# Patient Record
Sex: Male | Born: 1945 | Hispanic: No | Marital: Married | Smoking: Never smoker
Health system: Southern US, Community
[De-identification: ages and names within clinical notes are randomized; demographics above are authoritative.]

## PROBLEM LIST (undated history)

## (undated) HISTORY — PX: LEG AMPUTATION: SHX1105

---

## 2015-06-03 ENCOUNTER — Encounter (HOSPITAL_COMMUNITY): Payer: Self-pay | Admitting: Cardiology

## 2015-06-03 ENCOUNTER — Emergency Department (HOSPITAL_COMMUNITY): Payer: Medicare Other

## 2015-06-03 ENCOUNTER — Observation Stay (HOSPITAL_COMMUNITY)
Admission: EM | Admit: 2015-06-03 | Discharge: 2015-06-04 | Disposition: A | Discharge status: Home / Self Care | Payer: Medicare Other | Attending: Internal Medicine | Admitting: Internal Medicine

## 2015-06-03 DIAGNOSIS — R079 Chest pain, unspecified: Principal | ICD-10-CM | POA: Diagnosis present

## 2015-06-03 DIAGNOSIS — G8929 Other chronic pain: Secondary | ICD-10-CM | POA: Diagnosis not present

## 2015-06-03 DIAGNOSIS — M79605 Pain in left leg: Secondary | ICD-10-CM | POA: Diagnosis not present

## 2015-06-03 DIAGNOSIS — E785 Hyperlipidemia, unspecified: Secondary | ICD-10-CM

## 2015-06-03 LAB — CBC
HCT: 45.9 % (ref 39.0–52.0)
Hemoglobin: 15.5 g/dL (ref 13.0–17.0)
MCH: 28.7 pg (ref 26.0–34.0)
MCHC: 33.8 g/dL (ref 30.0–36.0)
MCV: 84.8 fL (ref 78.0–100.0)
Platelets: 224 K/uL (ref 150–400)
RBC: 5.41 MIL/uL (ref 4.22–5.81)
RDW: 12.5 % (ref 11.5–15.5)
WBC: 7.4 K/uL (ref 4.0–10.5)

## 2015-06-03 LAB — LIPID PANEL
Cholesterol: 217 mg/dL — ABNORMAL HIGH (ref 0–200)
HDL: 55 mg/dL (ref >40)
LDL Cholesterol: 150 mg/dL — ABNORMAL HIGH (ref 0–99)
Total CHOL/HDL Ratio: 3.9 ratio
Triglycerides: 62 mg/dL (ref <150)
VLDL: 12 mg/dL (ref 0–40)

## 2015-06-03 LAB — BASIC METABOLIC PANEL WITH GFR
Anion gap: 7 (ref 5–15)
BUN: 15 mg/dL (ref 6–20)
CO2: 27 mmol/L (ref 22–32)
Calcium: 9.3 mg/dL (ref 8.9–10.3)
Chloride: 103 mmol/L (ref 101–111)
Creatinine, Ser: 1.12 mg/dL (ref 0.61–1.24)
GFR calc Af Amer: >60 mL/min (ref >60)
GFR calc non Af Amer: >60 mL/min (ref >60)
Glucose, Bld: 96 mg/dL (ref 65–99)
Potassium: 3.6 mmol/L (ref 3.5–5.1)
Sodium: 137 mmol/L (ref 135–145)

## 2015-06-03 LAB — HEPATIC FUNCTION PANEL
ALK PHOS: 52 U/L (ref 38–126)
ALT: 17 U/L (ref 17–63)
AST: 21 U/L (ref 15–41)
Albumin: 3.8 g/dL (ref 3.5–5.0)
BILIRUBIN INDIRECT: 0.6 mg/dL (ref 0.3–0.9)
Bilirubin, Direct: 0.1 mg/dL (ref 0.1–0.5)
TOTAL PROTEIN: 7.9 g/dL (ref 6.5–8.1)
Total Bilirubin: 0.7 mg/dL (ref 0.3–1.2)

## 2015-06-03 LAB — TROPONIN I
Troponin I: <0.03 ng/mL (ref <0.031)
Troponin I: <0.03 ng/mL (ref <0.031)

## 2015-06-03 LAB — I-STAT TROPONIN, ED: Troponin i, poc: 0 ng/mL (ref 0.00–0.08)

## 2015-06-03 LAB — D-DIMER, QUANTITATIVE: D-Dimer, Quant: 0.31 ug{FEU}/mL (ref 0.00–0.50)

## 2015-06-03 MED ORDER — ONDANSETRON HCL 4 MG/2ML IJ SOLN: 4.0000 mg | Freq: Four times a day (QID) | INTRAMUSCULAR | Status: DC | PRN

## 2015-06-03 MED ORDER — ASPIRIN EC 81 MG PO TBEC
81.0000 mg | DELAYED_RELEASE_TABLET | Freq: Every day | ORAL | Status: DC
  Administered 2015-06-04: 81 mg via ORAL
  Filled 2015-06-03: qty 1

## 2015-06-03 MED ORDER — ACETAMINOPHEN 325 MG PO TABS
650.0000 mg | ORAL_TABLET | Freq: Four times a day (QID) | ORAL | Status: DC | PRN
Start: 2015-06-03 — End: 2015-06-04

## 2015-06-03 MED ORDER — ACETAMINOPHEN 650 MG RE SUPP: 650.0000 mg | Freq: Four times a day (QID) | RECTAL | Status: DC | PRN

## 2015-06-03 MED ORDER — ENOXAPARIN SODIUM 40 MG/0.4ML ~~LOC~~ SOLN
40.0000 mg | SUBCUTANEOUS | Status: DC
  Administered 2015-06-03: 40 mg via SUBCUTANEOUS
  Filled 2015-06-03: qty 0.4

## 2015-06-03 MED ORDER — ASPIRIN EC 81 MG PO TBEC: 81.0000 mg | DELAYED_RELEASE_TABLET | Freq: Every day | ORAL | Status: DC

## 2015-06-03 MED ORDER — ASPIRIN EC 81 MG PO TBEC: 162.0000 mg | DELAYED_RELEASE_TABLET | Freq: Once | ORAL | Status: DC

## 2015-06-03 MED ORDER — SODIUM CHLORIDE 0.9 % IJ SOLN
3.0000 mL | Freq: Two times a day (BID) | INTRAMUSCULAR | Status: DC
  Administered 2015-06-03: 3 mL via INTRAVENOUS

## 2015-06-03 MED ORDER — INFLUENZA VAC SPLIT QUAD 0.5 ML IM SUSY
0.5000 mL | PREFILLED_SYRINGE | INTRAMUSCULAR | Status: AC
  Administered 2015-06-04: 0.5 mL via INTRAMUSCULAR
  Filled 2015-06-03: qty 0.5

## 2015-06-03 MED ORDER — ONDANSETRON HCL 4 MG PO TABS: 4.0000 mg | ORAL_TABLET | Freq: Four times a day (QID) | ORAL | Status: DC | PRN
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
## 2015-06-03 NOTE — ED Notes (Signed)
Pt reports midsternal to left sided chest pain that started about 10am. Pt took 2 baby asa  PTA. Reports he was sleeping and when he woke up he noticed the pain. No cardiac hx reported.

## 2015-06-04 DIAGNOSIS — R079 Chest pain, unspecified: Secondary | ICD-10-CM | POA: Diagnosis not present

## 2015-06-04 DIAGNOSIS — R0789 Other chest pain: Secondary | ICD-10-CM

## 2015-06-04 DIAGNOSIS — M79605 Pain in left leg: Secondary | ICD-10-CM | POA: Diagnosis not present

## 2015-06-04 DIAGNOSIS — E785 Hyperlipidemia, unspecified: Secondary | ICD-10-CM | POA: Diagnosis not present

## 2015-06-04 DIAGNOSIS — G8929 Other chronic pain: Secondary | ICD-10-CM | POA: Diagnosis not present

## 2015-06-04 LAB — HEMOGLOBIN A1C
HEMOGLOBIN A1C: 5.6 % (ref 4.8–5.6)
MEAN PLASMA GLUCOSE: 114 mg/dL

## 2015-06-04 LAB — HIV ANTIBODY (ROUTINE TESTING W REFLEX): HIV Screen 4th Generation wRfx: NONREACTIVE

## 2015-06-04 LAB — HEPATITIS C ANTIBODY (REFLEX): HCV Ab: <0.1 s/co ratio (ref 0.0–0.9)

## 2015-06-04 LAB — HCV COMMENT:

## 2015-06-04 MED ORDER — ASPIRIN 81 MG PO TBEC: 81.0000 mg | DELAYED_RELEASE_TABLET | Freq: Every day | ORAL | Status: DC

## 2015-06-04 MED ORDER — PNEUMOCOCCAL 13-VAL CONJ VACC IM SUSP: 0.5000 mL | Freq: Once | INTRAMUSCULAR | Status: DC

## 2015-06-04 MED ORDER — PNEUMOCOCCAL 13-VAL CONJ VACC IM SUSP: 0.5000 mL | INTRAMUSCULAR | Status: DC

## 2015-06-04 MED ORDER — PNEUMOCOCCAL 13-VAL CONJ VACC IM SUSP
0.5000 mL | INTRAMUSCULAR | Status: AC | PRN
  Administered 2015-06-04: 0.5 mL via INTRAMUSCULAR
  Filled 2015-06-04: qty 0.5

## 2015-06-04 MED ORDER — PNEUMOCOCCAL 13-VAL CONJ VACC IM SUSP
0.5000 mL | INTRAMUSCULAR | Status: DC
Start: 2015-06-05 — End: 2015-06-04

## 2015-06-04 NOTE — Discharge Instructions (Signed)
It was a pleasure taking care of you, [name].  You were hospitalized for chest pain. It's likely your pain was not related to your heart as your EKG and heart enzymes were normal. We will have you follow up in our Internal Medicine Clinic on the ground floor of the hospital. Our clinic staff will give you a call to schedule an appointment.  Nonspecific Chest Pain  Chest pain can be caused by many different conditions. There is always a chance that your pain could be related to something serious, such as a heart attack or a blood clot in your lungs. Chest pain can also be caused by conditions that are not life-threatening. If you have chest pain, it is very important to follow up with your health care provider. CAUSES  Chest pain can be caused by:  Heartburn.  Pneumonia or bronchitis.  Anxiety or stress.  Inflammation around your heart (pericarditis) or lung (pleuritis or pleurisy).  A blood clot in your lung.  A collapsed lung (pneumothorax). It can develop suddenly on its own (spontaneous pneumothorax) or from trauma to the chest.  Shingles infection (varicella-zoster virus).  Heart attack.  Damage to the bones, muscles, and cartilage that make up your chest wall. This can include:  Bruised bones due to injury.  Strained muscles or cartilage due to frequent or repeated coughing or overwork.  Fracture to one or more ribs.  Sore cartilage due to inflammation (costochondritis). RISK FACTORS  Risk factors for chest pain may include:  Activities that increase your risk for trauma or injury to your chest.  Respiratory infections or conditions that cause frequent coughing.  Medical conditions or overeating that can cause heartburn.  Heart disease or family history of heart disease.  Conditions or health behaviors that increase your risk of developing a blood clot.  Having had chicken pox (varicella zoster). SIGNS AND SYMPTOMS Chest pain can feel like:  Burning or  tingling on the surface of your chest or deep in your chest.  Crushing, pressure, aching, or squeezing pain.  Dull or sharp pain that is worse when you move, cough, or take a deep breath.  Pain that is also felt in your back, neck, shoulder, or arm, or pain that spreads to any of these areas. Your chest pain may come and go, or it may stay constant. DIAGNOSIS Lab tests or other studies may be needed to find the cause of your pain. Your health care provider may have you take a test called an ambulatory ECG (electrocardiogram). An ECG records your heartbeat patterns at the time the test is performed. You may also have other tests, such as:  Transthoracic echocardiogram (TTE). During echocardiography, sound waves are used to create a picture of all of the heart structures and to look at how blood flows through your heart.  Transesophageal echocardiogram (TEE).This is a more advanced imaging test that obtains images from inside your body. It allows your health care provider to see your heart in finer detail.  Cardiac monitoring. This allows your health care provider to monitor your heart rate and rhythm in real time.  Holter monitor. This is a portable device that records your heartbeat and can help to diagnose abnormal heartbeats. It allows your health care provider to track your heart activity for several days, if needed.  Stress tests. These can be done through exercise or by taking medicine that makes your heart beat more quickly.  Blood tests.  Imaging tests. TREATMENT  Your treatment depends on what  is causing your chest pain. Treatment may include:  Medicines. These may include:  Acid blockers for heartburn.  Anti-inflammatory medicine.  Pain medicine for inflammatory conditions.  Antibiotic medicine, if an infection is present.  Medicines to dissolve blood clots.  Medicines to treat coronary artery disease.  Supportive care for conditions that do not require medicines.  This may include:  Resting.  Applying heat or cold packs to injured areas.  Limiting activities until pain decreases. HOME CARE INSTRUCTIONS  If you were prescribed an antibiotic medicine, finish it all even if you start to feel better.  Avoid any activities that bring on chest pain.  Do not use any tobacco products, including cigarettes, chewing tobacco, or electronic cigarettes. If you need help quitting, ask your health care provider.  Do not drink alcohol.  Take medicines only as directed by your health care provider.  Keep all follow-up visits as directed by your health care provider. This is important. This includes any further testing if your chest pain does not go away.  If heartburn is the cause for your chest pain, you may be told to keep your head raised (elevated) while sleeping. This reduces the chance that acid will go from your stomach into your esophagus.  Make lifestyle changes as directed by your health care provider. These may include:  Getting regular exercise. Ask your health care provider to suggest some activities that are safe for you.  Eating a heart-healthy diet. A registered dietitian can help you to learn healthy eating options.  Maintaining a healthy weight.  Managing diabetes, if necessary.  Reducing stress. SEEK MEDICAL CARE IF:  Your chest pain does not go away after treatment.  You have a rash with blisters on your chest.  You have a fever. SEEK IMMEDIATE MEDICAL CARE IF:   Your chest pain is worse.  You have an increasing cough, or you cough up blood.  You have severe abdominal pain.  You have severe weakness.  You faint.  You have chills.  You have sudden, unexplained chest discomfort.  You have sudden, unexplained discomfort in your arms, back, neck, or jaw.  You have shortness of breath at any time.  You suddenly start to sweat, or your skin gets clammy.  You feel nauseous or you vomit.  You suddenly feel  light-headed or dizzy.  Your heart begins to beat quickly, or it feels like it is skipping beats. These symptoms may represent a serious problem that is an emergency. Do not wait to see if the symptoms will go away. Get medical help right away. Call your local emergency services (911 in the [place] Do not drive yourself to the hospital.   This information is not intended to replace advice given to you by your health care provider. Make sure you discuss any questions you have with your health care provider.   Document Released: 04/05/2005 Document Revised: 07/17/2014 Document Reviewed: 01/30/2014 Elsevier Interactive Patient Education [employer]
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
## 2015-06-04 NOTE — Progress Notes (Signed)
Subjective: No acute events overnight. He feels well this morning with no complaints, daughter at bedside agrees. Eager to return home today.  Objective: Vital signs in last 24 hours: Filed Vitals:   06/03/15 1630 06/03/15 1701 06/03/15 2031 06/04/15 0539  BP: 150/69 155/66 139/65 133/65  Pulse: 58  74   Temp:  98.2 F (36.8 C) 98.1 F (36.7 C) 98.2 F (36.8 C)  TempSrc:  Oral Oral Oral  Resp: 12 16 18 18   Height:  5\' 5"  (1.651 m)    Weight:  56.201 kg (123 lb 14.4 oz)  55.566 kg (122 lb 8 oz)  SpO2: 100% 100% 97% 99%   Weight change:   Intake/Output Summary (Last 24 hours) at 06/04/15 1014 Last data filed at 06/04/15 0900  Gross per 24 hour  Intake    600 ml  Output    475 ml  Net    125 ml   GENERAL- alert, co-operative, NAD HEENT- Atraumatic, oral mucosa appears moist, numerous dental caries CARDIAC- RRR, no murmurs, rubs or gallops. RESP- CTAB, no wheezes or crackles. EXTREMITIES- no pedal edema, RLE BKA SKIN- Warm, dry, No rash or lesion. PSYCH- Normal mood and affect, appropriate thought content and speech.  Lab Results: Basic Metabolic Panel:  Recent Labs Lab 06/03/15 1230  NA 137  K 3.6  CL 103  CO2 27  GLUCOSE 96  BUN 15  CREATININE 1.12  CALCIUM 9.3   Liver Function Tests:  Recent Labs Lab 06/03/15 1649  AST 21  ALT 17  ALKPHOS 52  BILITOT 0.7  PROT 7.9  ALBUMIN 3.8   No results for input(s): LIPASE, AMYLASE in the last 168 hours. No results for input(s): AMMONIA in the last 168 hours. CBC:  Recent Labs Lab 06/03/15 1230  WBC 7.4  HGB 15.5  HCT 45.9  MCV 84.8  PLT 224   Cardiac Enzymes:  Recent Labs Lab 06/03/15 1649 06/03/15 2241  TROPONINI <0.03 <0.03   BNP: No results for input(s): PROBNP in the last 168 hours. D-Dimer:  Recent Labs Lab 06/03/15 1448  DDIMER 0.31   CBG: No results for input(s): GLUCAP in the last 168 hours. Hemoglobin A1C: No results for input(s): HGBA1C in the last 168 hours. Fasting  Lipid Panel:  Recent Labs Lab 06/03/15 1648  CHOL 217*  HDL 55  LDLCALC 150*  TRIG 62  CHOLHDL 3.9    Micro Results: No results found for this or any previous visit (from the past 240 hour(s)). Studies/Results: Dg Chest 2 View  06/03/2015  CLINICAL DATA:  Chest pain EXAM: CHEST - 2 VIEW COMPARISON:  None. FINDINGS: The heart size and mediastinal contours are within normal limits. Both lungs are clear. The visualized skeletal structures are unremarkable. IMPRESSION: No active disease. Electronically Signed   By: Alcide CleverMark  Lukens M.D.   On: 06/03/2015 12:53   Medications: I have reviewed the patient's current medications. Scheduled Meds: . aspirin EC  81 mg Oral Daily  . enoxaparin (LOVENOX) injection  40 mg Subcutaneous Q24H  . Influenza vac split quadrivalent PF  0.5 mL Intramuscular Tomorrow-1000  . [START ON 06/05/2015] pneumococcal 13-valent conjugate vaccine  0.5 mL Intramuscular Tomorrow-1000  . sodium chloride  3 mL Intravenous Q12H   Continuous Infusions:  PRN Meds:.acetaminophen **OR** acetaminophen, ondansetron **OR** ondansetron (ZOFRAN) IV Assessment/Plan: Chest pain: Pain remains resolved overnight. EKG with insignificant ST variations ~181mm. Troponins negative. Investigating risk factors he has a mildly elevated LDL otherwise nothing found. We will discharge him today and  recommend him an [facility] follow up. -F/U Hgb A1c, HIV, Hep C  Diet: Regular DVT ppx: [place] enoxaparin FULL CODE  Dispo:  Anticipated discharge to home today.   The patient does not have a current PCP (No primary care provider on file.) and does need an [facility] hospital follow-up appointment after discharge.  The patient does not know have transportation limitations that hinder transportation to clinic appointments.    Fuller Plan, MD 06/04/2015, 10:14 AM
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
## 2016-06-14 IMAGING — CR DG CHEST 2V
2 series · 2 of 2 positions shown · non-contrast
Comparison: None.

CLINICAL DATA: Chest pain

EXAM:
CHEST - 2 VIEW

[chest pa]
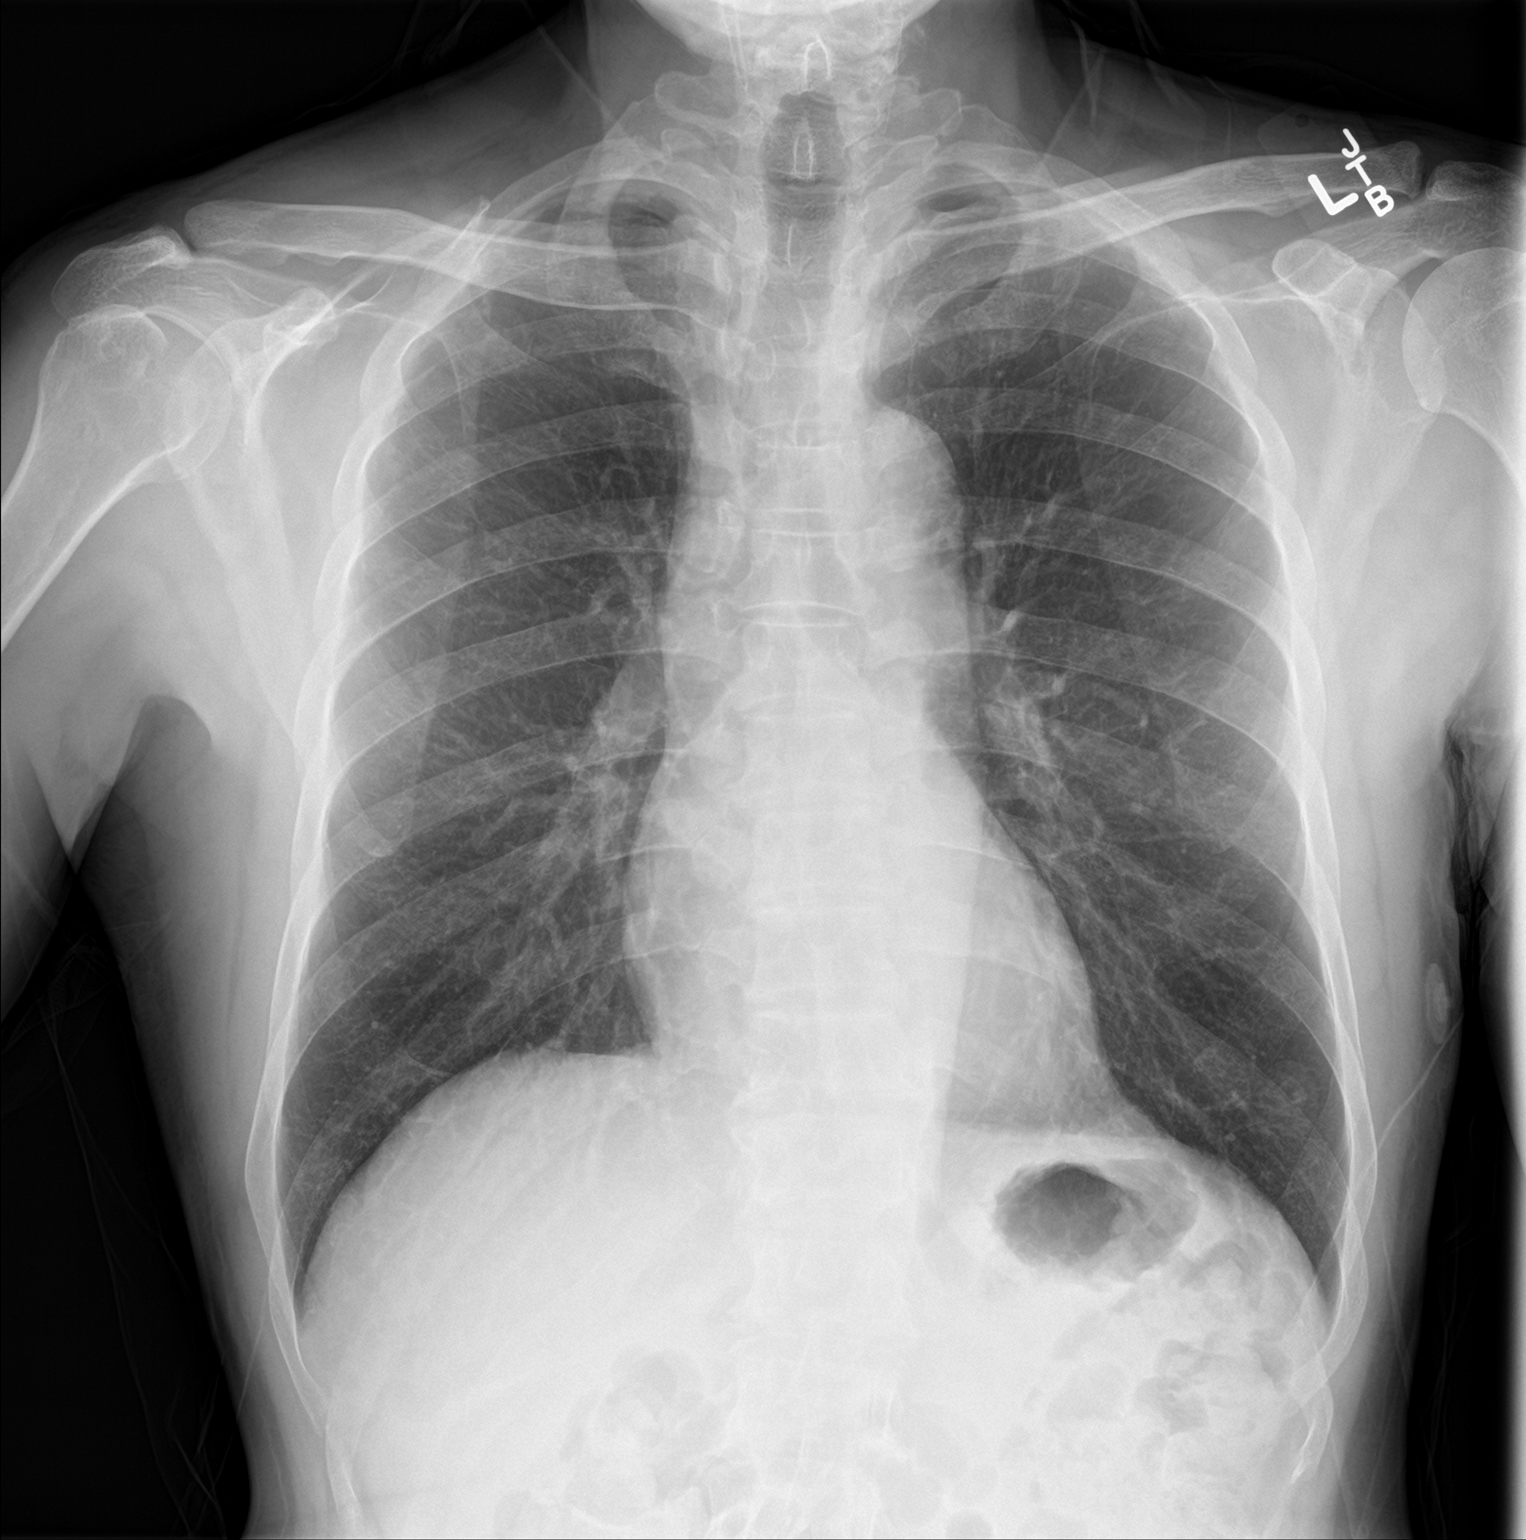

[chest lat]
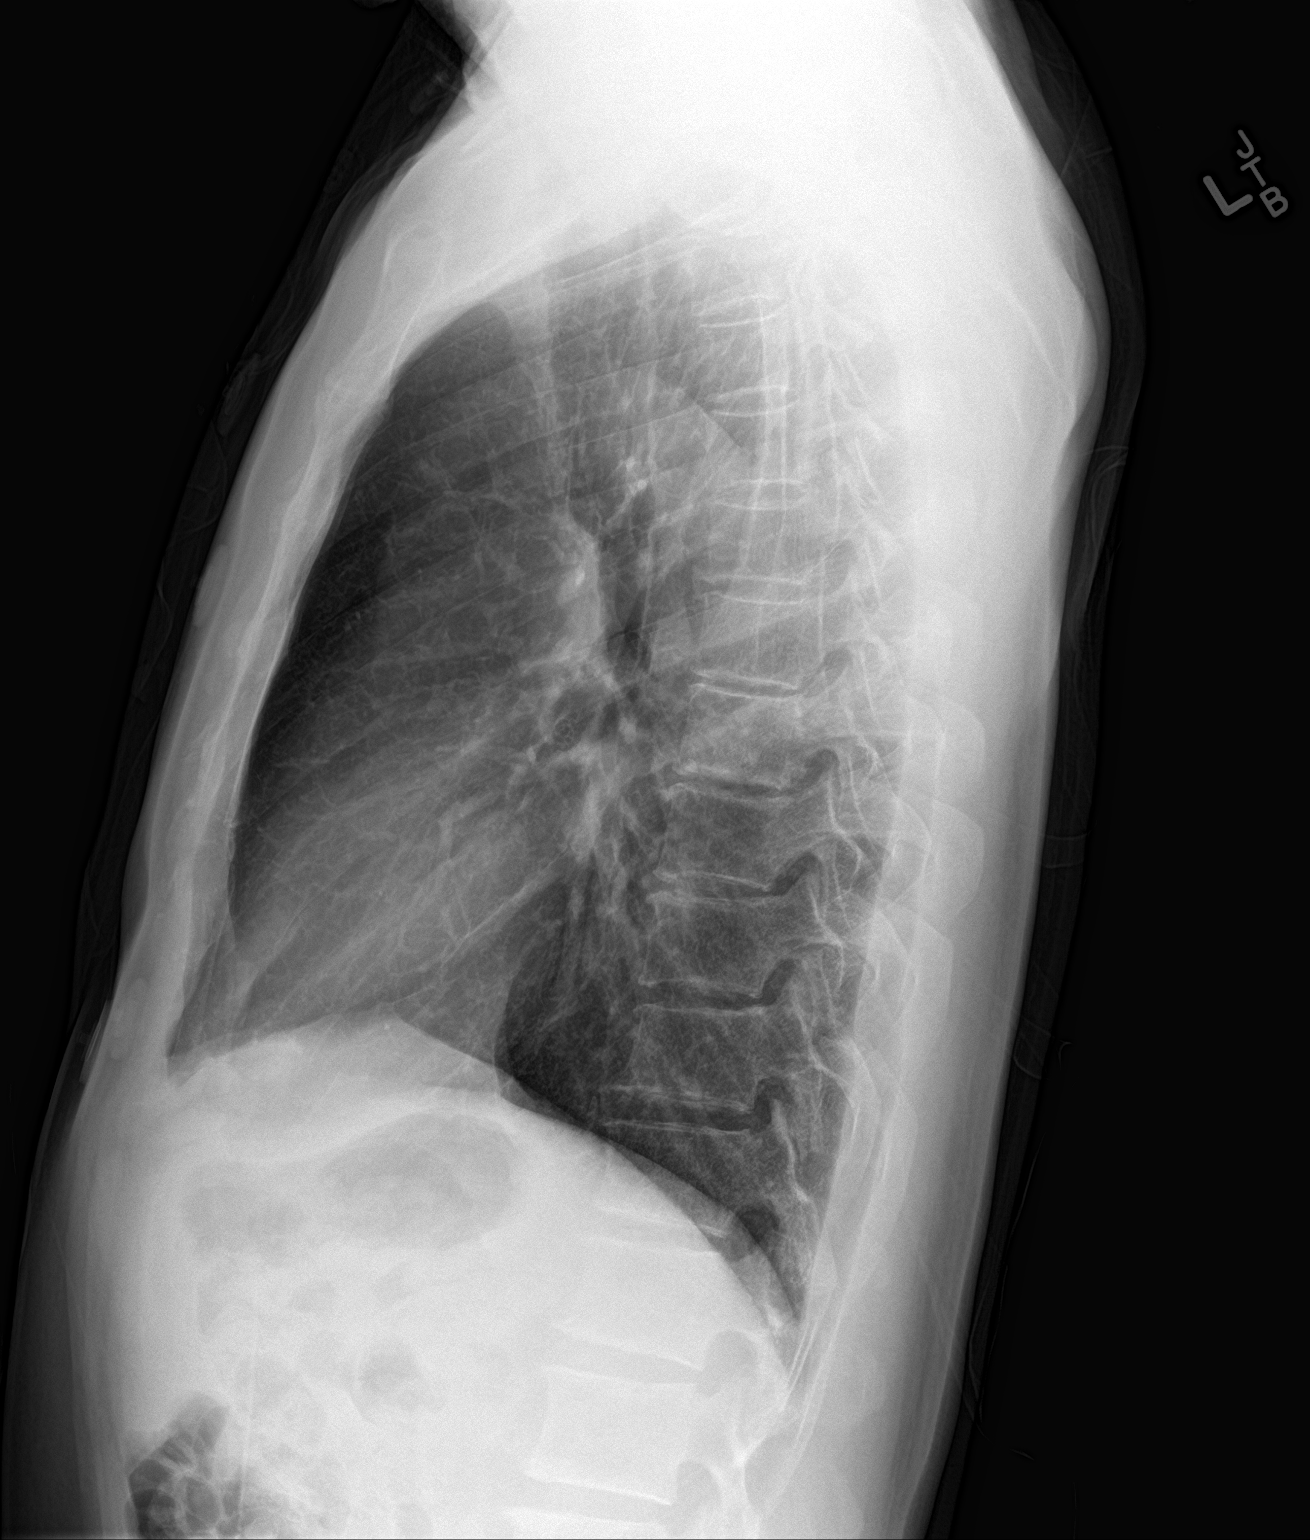

[2 of 2 positions shown; findings below may reference images not displayed]

FINDINGS: The heart size and mediastinal contours are within normal limits.
Both lungs are clear. The visualized skeletal structures are
unremarkable.
IMPRESSION: No active disease.

## 2024-03-06 ENCOUNTER — Inpatient Hospital Stay (HOSPITAL_COMMUNITY)
Admission: EM | Admit: 2024-03-06 | Discharge: 2024-03-09 | DRG: 871 | Disposition: A | Discharge status: Home Health | Attending: Internal Medicine | Admitting: Internal Medicine

## 2024-03-06 ENCOUNTER — Emergency Department (HOSPITAL_COMMUNITY)

## 2024-03-06 ENCOUNTER — Other Ambulatory Visit: Payer: Self-pay

## 2024-03-06 DIAGNOSIS — A419 Sepsis, unspecified organism: Secondary | ICD-10-CM | POA: Diagnosis not present

## 2024-03-06 DIAGNOSIS — Z603 Acculturation difficulty: Secondary | ICD-10-CM | POA: Diagnosis present

## 2024-03-06 DIAGNOSIS — A4151 Sepsis due to Escherichia coli [E. coli]: Secondary | ICD-10-CM | POA: Diagnosis present

## 2024-03-06 DIAGNOSIS — N390 Urinary tract infection, site not specified: Secondary | ICD-10-CM | POA: Diagnosis present

## 2024-03-06 DIAGNOSIS — G9341 Metabolic encephalopathy: Secondary | ICD-10-CM | POA: Diagnosis present

## 2024-03-06 DIAGNOSIS — I10 Essential (primary) hypertension: Secondary | ICD-10-CM | POA: Diagnosis present

## 2024-03-06 DIAGNOSIS — E869 Volume depletion, unspecified: Secondary | ICD-10-CM | POA: Diagnosis present

## 2024-03-06 DIAGNOSIS — N1831 Chronic kidney disease, stage 3a: Secondary | ICD-10-CM | POA: Diagnosis present

## 2024-03-06 DIAGNOSIS — F54 Psychological and behavioral factors associated with disorders or diseases classified elsewhere: Secondary | ICD-10-CM | POA: Diagnosis present

## 2024-03-06 DIAGNOSIS — Z89511 Acquired absence of right leg below knee: Secondary | ICD-10-CM

## 2024-03-06 DIAGNOSIS — I129 Hypertensive chronic kidney disease with stage 1 through stage 4 chronic kidney disease, or unspecified chronic kidney disease: Secondary | ICD-10-CM | POA: Diagnosis present

## 2024-03-06 DIAGNOSIS — Z87440 Personal history of urinary (tract) infections: Secondary | ICD-10-CM

## 2024-03-06 DIAGNOSIS — F0390 Unspecified dementia without behavioral disturbance: Secondary | ICD-10-CM | POA: Diagnosis present

## 2024-03-06 DIAGNOSIS — R4182 Altered mental status, unspecified: Secondary | ICD-10-CM | POA: Diagnosis present

## 2024-03-06 DIAGNOSIS — F05 Delirium due to known physiological condition: Secondary | ICD-10-CM | POA: Diagnosis not present

## 2024-03-06 DIAGNOSIS — E785 Hyperlipidemia, unspecified: Secondary | ICD-10-CM | POA: Diagnosis present

## 2024-03-06 LAB — COMPREHENSIVE METABOLIC PANEL WITH GFR
ALT: 21 U/L (ref 0–44)
AST: 37 U/L (ref 15–41)
Albumin: 3.5 g/dL (ref 3.5–5.0)
Alkaline Phosphatase: 59 U/L (ref 38–126)
Anion gap: 14 (ref 5–15)
BUN: 15 mg/dL (ref 8–23)
CO2: 20 mmol/L — ABNORMAL LOW (ref 22–32)
Calcium: 9.1 mg/dL (ref 8.9–10.3)
Chloride: 101 mmol/L (ref 98–111)
Creatinine, Ser: 1.47 mg/dL — ABNORMAL HIGH (ref 0.61–1.24)
GFR, Estimated: 48 mL/min — ABNORMAL LOW (ref >60)
Glucose, Bld: 130 mg/dL — ABNORMAL HIGH (ref 70–99)
Potassium: 3.8 mmol/L (ref 3.5–5.1)
Sodium: 135 mmol/L (ref 135–145)
Total Bilirubin: 1.1 mg/dL (ref 0.0–1.2)
Total Protein: 7.8 g/dL (ref 6.5–8.1)

## 2024-03-06 LAB — CBC WITH DIFFERENTIAL/PLATELET
Abs Immature Granulocytes: 0.08 K/uL — ABNORMAL HIGH (ref 0.00–0.07)
Basophils Absolute: 0.1 K/uL (ref 0.0–0.1)
Basophils Relative: 0 %
Eosinophils Absolute: 0 K/uL (ref 0.0–0.5)
Eosinophils Relative: 0 %
HCT: 45.1 % (ref 39.0–52.0)
Hemoglobin: 15.2 g/dL (ref 13.0–17.0)
Immature Granulocytes: 1 %
Lymphocytes Relative: 11 %
Lymphs Abs: 1.6 K/uL (ref 0.7–4.0)
MCH: 28.8 pg (ref 26.0–34.0)
MCHC: 33.7 g/dL (ref 30.0–36.0)
MCV: 85.4 fL (ref 80.0–100.0)
Monocytes Absolute: 1.7 K/uL — ABNORMAL HIGH (ref 0.1–1.0)
Monocytes Relative: 12 %
Neutro Abs: 11.1 K/uL — ABNORMAL HIGH (ref 1.7–7.7)
Neutrophils Relative %: 76 %
Platelets: 222 K/uL (ref 150–400)
RBC: 5.28 MIL/uL (ref 4.22–5.81)
RDW: 12.1 % (ref 11.5–15.5)
WBC: 14.5 K/uL — ABNORMAL HIGH (ref 4.0–10.5)
nRBC: 0 % (ref 0.0–0.2)

## 2024-03-06 LAB — URINALYSIS, W/ REFLEX TO CULTURE (INFECTION SUSPECTED)
Bilirubin Urine: NEGATIVE
Glucose, UA: NEGATIVE mg/dL
Ketones, ur: 5 mg/dL — AB
Nitrite: POSITIVE — AB
Protein, ur: 100 mg/dL — AB
Specific Gravity, Urine: 1.023 (ref 1.005–1.030)
pH: 6 (ref 5.0–8.0)

## 2024-03-06 LAB — PROTIME-INR
INR: 1 (ref 0.8–1.2)
Prothrombin Time: 14.2 s (ref 11.4–15.2)

## 2024-03-06 LAB — I-STAT CG4 LACTIC ACID, ED: Lactic Acid, Venous: 2 mmol/L (ref 0.5–1.9)

## 2024-03-06 LAB — CBG MONITORING, ED: Glucose-Capillary: 135 mg/dL — ABNORMAL HIGH (ref 70–99)

## 2024-03-06 MED ORDER — SODIUM CHLORIDE 0.9 % IV SOLN
2.0000 g | Freq: Once | INTRAVENOUS | Status: AC
  Administered 2024-03-06: 2 g via INTRAVENOUS
  Filled 2024-03-06: qty 20

## 2024-03-06 MED ORDER — POLYETHYLENE GLYCOL 3350 17 G PO PACK: 17.0000 g | PACK | Freq: Every day | ORAL | Status: DC | PRN

## 2024-03-06 MED ORDER — ACETAMINOPHEN 500 MG PO TABS
1000.0000 mg | ORAL_TABLET | Freq: Four times a day (QID) | ORAL | Status: DC | PRN
  Administered 2024-03-07: 1000 mg via ORAL
  Filled 2024-03-06: qty 2

## 2024-03-06 MED ORDER — LACTATED RINGERS IV BOLUS (SEPSIS)
1000.0000 mL | Freq: Once | INTRAVENOUS | Status: AC
  Administered 2024-03-06: 1000 mL via INTRAVENOUS

## 2024-03-06 MED ORDER — ENOXAPARIN SODIUM 40 MG/0.4ML IJ SOSY
40.0000 mg | PREFILLED_SYRINGE | INTRAMUSCULAR | Status: DC
  Administered 2024-03-07 – 2024-03-09 (×3): 40 mg via SUBCUTANEOUS
  Filled 2024-03-06 (×3): qty 0.4

## 2024-03-06 MED ORDER — LACTATED RINGERS IV BOLUS (SEPSIS)
500.0000 mL | Freq: Once | INTRAVENOUS | Status: AC
  Administered 2024-03-06: 500 mL via INTRAVENOUS

## 2024-03-06 MED ORDER — MELATONIN 3 MG PO TABS: 6.0000 mg | ORAL_TABLET | Freq: Every evening | ORAL | Status: DC | PRN

## 2024-03-06 MED ORDER — LACTATED RINGERS IV SOLN: INTRAVENOUS | Status: DC

## 2024-03-06 MED ORDER — ALBUTEROL SULFATE (2.5 MG/3ML) 0.083% IN NEBU: 2.5000 mg | INHALATION_SOLUTION | RESPIRATORY_TRACT | Status: DC | PRN

## 2024-03-06 MED ORDER — SODIUM CHLORIDE 0.9% FLUSH
3.0000 mL | Freq: Two times a day (BID) | INTRAVENOUS | Status: DC
  Administered 2024-03-06 – 2024-03-09 (×6): 3 mL via INTRAVENOUS

## 2024-03-06 MED ORDER — ACETAMINOPHEN 500 MG PO TABS
1000.0000 mg | ORAL_TABLET | Freq: Once | ORAL | Status: AC
  Administered 2024-03-06: 1000 mg via ORAL
  Filled 2024-03-06: qty 2

## 2024-03-06 MED ORDER — LACTATED RINGERS IV BOLUS: 1000.0000 mL | Freq: Once | INTRAVENOUS | Status: DC

## 2024-03-06 MED ORDER — LACTATED RINGERS IV SOLN: INTRAVENOUS | Status: AC

## 2024-03-06 MED ORDER — SODIUM CHLORIDE 0.9 % IV SOLN
2.0000 g | INTRAVENOUS | Status: DC
  Administered 2024-03-07 – 2024-03-08 (×2): 2 g via INTRAVENOUS
  Filled 2024-03-06 (×2): qty 20

## 2024-03-06 MED ORDER — LACTATED RINGERS IV BOLUS (SEPSIS)
250.0000 mL | Freq: Once | INTRAVENOUS | Status: AC
  Administered 2024-03-06: 250 mL via INTRAVENOUS

## 2024-03-06 MED ORDER — ONDANSETRON HCL 4 MG/2ML IJ SOLN: 4.0000 mg | Freq: Four times a day (QID) | INTRAMUSCULAR | Status: DC | PRN

## 2024-03-06 NOTE — ED Notes (Signed)
(  Daughter) [name] primary contact [phone]
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
## 2024-03-06 NOTE — ED Triage Notes (Signed)
 Patient with hx of dementia from home via EMS. LKW 0400 today. At 1300 today patient had weakness and incontinence which is abnormal for him. Daughter attempting to transport to ED but stopped en route to call EMS from the side of the road. Moving all extremities equally. Daughter states no slurred speech, just that he sounds tired. Hx of frequent UTIs.

## 2024-03-06 NOTE — ED Provider Notes (Signed)
 [place] EMERGENCY DEPARTMENT AT [facility] Provider Note   CSN: [id number] Arrival date & time: 03/06/24  8176     Patient presents with: Altered Mental Status   Bradley Rojas is a 78 y.o. male.  Patient past history significant for frequent UTIs presents to the emergency department today with concerns of altered mental status.  Patient's daughter at bedside reports that she last noted baseline status around 1 PM but last known normal was around 4 AM.  Daughter called EMS due to concerns that patient was becoming more confused on transport here.  No reported slurred speech, weakness, but does endorse concerns that he may have a UTI.  No reported abnormal urine odor, color, or obvious fevers.  Patient does wear a catheter chronically.   Altered Mental Status Presenting symptoms: confusion        Prior to Admission medications   Medication Sig Start Date End Date Taking? Authorizing Provider  aspirin  EC 81 MG EC tablet Take 1 tablet (81 mg total) by mouth daily. Patient not taking: Reported on 03/06/2024 06/04/15   Rivet, Connell PARAS, MD  benazepril-hydrochlorthiazide (LOTENSIN HCT) 20-25 MG tablet Take 1 tablet by mouth daily. Patient not taking: Reported on 03/06/2024 03/06/24   [provider]    Allergies: Patient has no known allergies.    Review of Systems  Psychiatric/Behavioral:  Positive for confusion.   All other systems reviewed and are negative.   Updated Vital Signs BP (!) 160/71   Pulse 91   Temp (!) 104.6 F (40.3 C) (Rectal)   Resp 16   SpO2 100%   Physical Exam Vitals and nursing note reviewed.  Constitutional:      General: He is not in acute distress.    Appearance: He is well-developed. He is ill-appearing.  HENT:     Head: Normocephalic and atraumatic.  Eyes:     Conjunctiva/sclera: Conjunctivae normal.  Cardiovascular:     Rate and Rhythm: Normal rate and regular rhythm.     Heart sounds: No murmur heard. Pulmonary:     Effort:  Pulmonary effort is normal. No respiratory distress.     Breath sounds: Normal breath sounds.  Abdominal:     Palpations: Abdomen is soft.     Tenderness: There is no abdominal tenderness.  Musculoskeletal:        General: No swelling.     Cervical back: Neck supple.  Skin:    General: Skin is warm and dry.     Capillary Refill: Capillary refill takes less than 2 seconds.  Neurological:     Mental Status: He is alert.  Psychiatric:        Mood and Affect: Mood normal.     (all labs ordered are listed, but only abnormal results are displayed) Labs Reviewed  COMPREHENSIVE METABOLIC PANEL WITH GFR - Abnormal; Notable for the following components:      Result Value   CO2 20 (*)    Glucose, Bld 130 (*)    Creatinine, Ser 1.47 (*)    GFR, Estimated 48 (*)    All other components within normal limits  URINALYSIS, W/ REFLEX TO CULTURE (INFECTION SUSPECTED) - Abnormal; Notable for the following components:   Color, Urine AMBER (*)    APPearance HAZY (*)    Hgb urine dipstick MODERATE (*)    Ketones, ur 5 (*)    Protein, ur 100 (*)    Nitrite POSITIVE (*)    Leukocytes,Ua SMALL (*)    Bacteria, UA  MANY (*)    All other components within normal limits  CBC WITH DIFFERENTIAL/PLATELET - Abnormal; Notable for the following components:   WBC 14.5 (*)    Neutro Abs 11.1 (*)    Monocytes Absolute 1.7 (*)    Abs Immature Granulocytes 0.08 (*)    All other components within normal limits  CBG MONITORING, ED - Abnormal; Notable for the following components:   Glucose-Capillary 135 (*)    All other components within normal limits  I-STAT CG4 LACTIC ACID, ED - Abnormal; Notable for the following components:   Lactic Acid, Venous 2.0 (*)    All other components within normal limits  CULTURE, BLOOD (ROUTINE X 2)  CULTURE, BLOOD (ROUTINE X 2)  URINE CULTURE  PROTIME-INR  CBC WITH DIFFERENTIAL/PLATELET  I-STAT CG4 LACTIC ACID, ED    EKG: EKG Interpretation Date/Time:  Thursday March 06 2024 18:35:11 EDT Ventricular Rate:  91 PR Interval:  152 QRS Duration:  80 QT Interval:  337 QTC Calculation: 415 R Axis:   14  Text Interpretation: Sinus rhythm Normal ECG When compared with ECG of 06/04/2015, Premature atrial complexes are no longer present Confirmed by Raford Lenis ([id number]) on 03/06/2024 11:18:31 PM  Radiology: [facility] Chest Port 1 View Result Date: 03/06/2024 CLINICAL DATA:  Sepsis. EXAM: PORTABLE CHEST 1 VIEW COMPARISON:  Chest radiograph dated 06/03/2015 FINDINGS: Minimal bibasilar atelectasis. No focal consolidation, pleural effusion, or pneumothorax. The cardiac silhouette is within normal limits. No acute osseous pathology. IMPRESSION: No active disease. Electronically Signed   By: Vanetta Chou M.D.   On: 03/06/2024 19:28     .Critical Care  Performed by: Bertran Zeimet A, PA-C Authorized by: Jaiveer Panas A, PA-C   Critical care provider statement:    Critical care time (minutes):  57   Critical care start time:  03/06/2024 10:25 PM   Critical care end time:  03/06/2024 11:22 PM   Critical care time was exclusive of:  Separately billable procedures and treating other patients   Critical care was necessary to treat or prevent imminent or life-threatening deterioration of the following conditions:  Sepsis   Critical care was time spent personally by me on the following activities:  Ordering and performing treatments and interventions, ordering and review of radiographic studies, ordering and review of laboratory studies, discussions with consultants, development of treatment plan with patient or surrogate, evaluation of patient's response to treatment and examination of patient   I assumed direction of critical care for this patient from another provider in my specialty: no     Care discussed with: admitting provider      Medications Ordered in the ED  lactated ringers  infusion (has no administration in time range)  lactated ringers  bolus 1,000 mL (1,000 mLs  Intravenous New Bag/Given 03/06/24 2302)    And  lactated ringers  bolus 500 mL (500 mLs Intravenous New Bag/Given 03/06/24 2301)    And  lactated ringers  bolus 250 mL (has no administration in time range)  cefTRIAXone  (ROCEPHIN ) 2 g in sodium chloride  0.9 % 100 mL IVPB (0 g Intravenous Stopped 03/06/24 2123)  acetaminophen  (TYLENOL ) tablet 1,000 mg (1,000 mg Oral Given 03/06/24 2216)                                    Medical Decision Making Amount and/or Complexity of Data Reviewed Labs: ordered. Radiology: ordered.  Risk OTC drugs. Prescription drug management. Decision regarding hospitalization.  This patient presents to the ED for concern of altered mental status, this involves an extensive number of treatment options, and is a complaint that carries with it a high risk of complications and morbidity.  The differential diagnosis includes urosepsis, viral URI, pneumonia, dehydration   Co morbidities that complicate the patient evaluation  Hyperlipidemia, right BKA   Lab Tests:  I Ordered, and personally interpreted labs.  The pertinent results include: CBC with leukocytosis at 14.5, lactic acid elevated 2.0, CMP with creatinine 1.47 GFR 48 but no recent labs to compare to, CBG unremarkable 135, UA consistent with infection with nitrates, leukocytes, and bacteria seen, urine culture pending   Imaging Studies ordered:  I ordered imaging studies including chest x-ray I independently visualized and interpreted imaging which showed no acute cardiopulmonary process I agree with the radiologist interpretation   Cardiac Monitoring: / EKG:  The patient was maintained on a cardiac monitor.  I personally viewed and interpreted the cardiac monitored which showed an underlying rhythm of: Sinus rhythm   Consultations Obtained:  I requested consultation with the hospitalist,  and discussed lab and imaging findings as well as pertinent plan - they recommend: Spoke with Dr. Segars  who will be admitting patient.   Problem List / ED Course / Critical interventions / Medication management  Patient with history of chronic urinary catheter use, right BKA presents to the ED with family with concerns of AMS. Family reports LKN around 4am this morning. Notably changed around 1pm with increased confusion and slow to respond per daughters report. 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 Questionable temperature recording with triage showing fever and initial temperature in the room showing normal temp at 98.7. Will have nurse collect a rectal temperature. Lactic acid elevated at 2.0. Rectal temperature of 104.6. Code sepsis initiated. There is a strong odor of likely UTI in the room. Patient's exam is otherwise unremarkable with no focal abdominal tenderness. Difficulty gauging cognitive state due to language barrier but he appears to be able to follow all commands appropriately. No abnormal heart or lung sounds on my initial exam. CBC with elevation white count of 14.5, CMP with possible mild dehydration creatinine 1.47 and GFR 48 but no prior labs to compare to, blood cultures collected and pending, UA consistent with infection with many bacteria seen as well as leukocytes, nitrites, and blood.  Low concern for contamination with only 0-5 squamous cells present.  Started on Rocephin .  Fluid boluses initiated.  Tylenol  pending at this time.  Consult to medicine placed. Spoke with Dr. Segars, hospitalist, who will be admitting patient. I ordered medication including Rocephin , Tylenol , fluids for UTI, fever, sepsis Reevaluation of the patient after these medicines showed that the patient improved I have reviewed the patients home medicines and have made adjustments as needed   Social Determinants of Health:  None   Test / Admission - Considered:  Admission required due to urosepsis concern.     Final diagnoses:  Sepsis without acute organ dysfunction, due to unspecified organism [facility])  Urinary tract infection associated with catheterization of urinary tract, unspecified indwelling urinary catheter type, initial encounter [facility])    ED Discharge Orders     None          Cecily Legrand LABOR, PA-C 03/06/24 2324    Pamella Ozell LABOR, DO 03/14/24 1115
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
## 2024-03-06 NOTE — Sepsis Progress Note (Signed)
 Elink monitoring for the code sepsis protocol.

## 2024-03-06 NOTE — ED Notes (Signed)
 Pt has condom cath placed, no urine output at this time.

## 2024-03-06 NOTE — ED Notes (Signed)
 Pt had a very difficult time swallowing pills, pt endorsed confusion with instructions to swallow. Pt swallowed both pills. Suggestion to crush any pills in future administration

## 2024-03-07 ENCOUNTER — Encounter (HOSPITAL_COMMUNITY): Payer: Self-pay | Admitting: Internal Medicine

## 2024-03-07 DIAGNOSIS — N390 Urinary tract infection, site not specified: Secondary | ICD-10-CM | POA: Diagnosis not present

## 2024-03-07 LAB — CBC
HCT: 42.2 % (ref 39.0–52.0)
Hemoglobin: 14.2 g/dL (ref 13.0–17.0)
MCH: 28.4 pg (ref 26.0–34.0)
MCHC: 33.6 g/dL (ref 30.0–36.0)
MCV: 84.4 fL (ref 80.0–100.0)
Platelets: 200 K/uL (ref 150–400)
RBC: 5 MIL/uL (ref 4.22–5.81)
RDW: 12.2 % (ref 11.5–15.5)
WBC: 12.7 K/uL — ABNORMAL HIGH (ref 4.0–10.5)
nRBC: 0 % (ref 0.0–0.2)

## 2024-03-07 LAB — LACTIC ACID, PLASMA: Lactic Acid, Venous: 1.3 mmol/L (ref 0.5–1.9)

## 2024-03-07 LAB — BASIC METABOLIC PANEL WITH GFR
Anion gap: 10 (ref 5–15)
BUN: 13 mg/dL (ref 8–23)
CO2: 25 mmol/L (ref 22–32)
Calcium: 8.5 mg/dL — ABNORMAL LOW (ref 8.9–10.3)
Chloride: 101 mmol/L (ref 98–111)
Creatinine, Ser: 1.48 mg/dL — ABNORMAL HIGH (ref 0.61–1.24)
GFR, Estimated: 48 mL/min — ABNORMAL LOW (ref >60)
Glucose, Bld: 132 mg/dL — ABNORMAL HIGH (ref 70–99)
Potassium: 3.5 mmol/L (ref 3.5–5.1)
Sodium: 136 mmol/L (ref 135–145)

## 2024-03-07 LAB — MAGNESIUM: Magnesium: 1.7 mg/dL (ref 1.7–2.4)

## 2024-03-07 LAB — VITAMIN B12: Vitamin B-12: 216 pg/mL (ref 180–914)

## 2024-03-07 LAB — PHOSPHORUS: Phosphorus: 3.1 mg/dL (ref 2.5–4.6)

## 2024-03-07 LAB — TSH: TSH: 1.85 u[IU]/mL (ref 0.350–4.500)

## 2024-03-07 MED ORDER — ACETAMINOPHEN 160 MG/5ML PO SOLN
650.0000 mg | Freq: Four times a day (QID) | ORAL | Status: DC | PRN
  Administered 2024-03-07 – 2024-03-09 (×3): 650 mg via ORAL
  Filled 2024-03-07 (×3): qty 20.3
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
## 2024-03-07 NOTE — Progress Notes (Signed)
 Transition of Care [facility]) - Inpatient Brief Assessment   Patient Details  Name: Bradley Rojas MRN: [id number] Date of Birth: January 24, 1946  Transition of Care [facility]) CM/SW Contact:    Rosaline JONELLE Joe, RN Phone Number: 03/07/2024, 11:10 AM   Clinical Narrative: Patient admitted from home with complicated UTI.  Patient lives with family and has RW at home.  I spoke with the daughter, Bradley Rojas [phone] and she was provided Medicare choice regarding home health and DME and she did not have a preference.  I called [employer] and requested delivery of 3:1 to the home.  DME order for 3:1 placed to be co-signed by the MD.  I called Hedda CHEADLE and Darleene, RNCM accepted for [facility] PT, OT.  HH orders for PT/OT placed to be co-signed by MD.  PCP follow up with [facility] - daughter, Bradley Rojas plans to call the office and schedule post-hospital appointment in the next 7-10 days.  No other IP Care management needs.  Patient will discharge home with family by car when ready for discharge.   Transition of Care Asessment: Insurance and Status: (P) Insurance coverage has been reviewed Patient has primary care physician: (P) Yes Home environment has been reviewed: (P) from home with spouse, daughter Prior level of function:: (P) family assistance, RW Prior/Current Home Services: (P) No current home services Social Drivers of Health Review: (P) SDOH reviewed interventions complete Readmission risk has been reviewed: (P) Yes Transition of care needs: (P) transition of care needs identified, TOC will continue to follow

## 2024-03-07 NOTE — Plan of Care (Signed)
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
## 2024-03-07 NOTE — ED Notes (Signed)
 Pt found to have removed IV and condom cath himself. Pt cleaned and changed. New IV inserted.
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
## 2024-03-07 NOTE — Progress Notes (Signed)
    Durable Medical Equipment  (From admission, onward)           Start     Ordered   03/07/24 1101  For home use only DME Bedside commode  Once       Comments: Patient is unable to mobilize safely to bathroom and will needs 3:1 next to bedside at home for toileting safety and shower seat.  Question Answer Comment  Patient needs a bedside commode to treat with the following condition UTI (urinary tract infection)   Patient needs a bedside commode to treat with the following condition Physical deconditioning      03/07/24 1102

## 2024-03-08 DIAGNOSIS — N390 Urinary tract infection, site not specified: Secondary | ICD-10-CM | POA: Diagnosis not present

## 2024-03-08 LAB — CBC WITH DIFFERENTIAL/PLATELET
Abs Immature Granulocytes: 0.06 K/uL (ref 0.00–0.07)
Basophils Absolute: 0.1 K/uL (ref 0.0–0.1)
Basophils Relative: 0 %
Eosinophils Absolute: 0 K/uL (ref 0.0–0.5)
Eosinophils Relative: 0 %
HCT: 39.4 % (ref 39.0–52.0)
Hemoglobin: 13.2 g/dL (ref 13.0–17.0)
Immature Granulocytes: 0 %
Lymphocytes Relative: 16 %
Lymphs Abs: 2.4 K/uL (ref 0.7–4.0)
MCH: 28.1 pg (ref 26.0–34.0)
MCHC: 33.5 g/dL (ref 30.0–36.0)
MCV: 84 fL (ref 80.0–100.0)
Monocytes Absolute: 1.6 K/uL — ABNORMAL HIGH (ref 0.1–1.0)
Monocytes Relative: 11 %
Neutro Abs: 10.3 K/uL — ABNORMAL HIGH (ref 1.7–7.7)
Neutrophils Relative %: 73 %
Platelets: 184 K/uL (ref 150–400)
RBC: 4.69 MIL/uL (ref 4.22–5.81)
RDW: 12.2 % (ref 11.5–15.5)
WBC: 14.4 K/uL — ABNORMAL HIGH (ref 4.0–10.5)
nRBC: 0 % (ref 0.0–0.2)

## 2024-03-08 LAB — BASIC METABOLIC PANEL WITH GFR
Anion gap: 12 (ref 5–15)
BUN: 11 mg/dL (ref 8–23)
CO2: 21 mmol/L — ABNORMAL LOW (ref 22–32)
Calcium: 8.4 mg/dL — ABNORMAL LOW (ref 8.9–10.3)
Chloride: 101 mmol/L (ref 98–111)
Creatinine, Ser: 1.37 mg/dL — ABNORMAL HIGH (ref 0.61–1.24)
GFR, Estimated: 52 mL/min — ABNORMAL LOW (ref >60)
Glucose, Bld: 107 mg/dL — ABNORMAL HIGH (ref 70–99)
Potassium: 3.3 mmol/L — ABNORMAL LOW (ref 3.5–5.1)
Sodium: 134 mmol/L — ABNORMAL LOW (ref 135–145)

## 2024-03-08 LAB — URINE CULTURE: Culture: >=100000 — AB

## 2024-03-08 MED ORDER — POTASSIUM CHLORIDE CRYS ER 20 MEQ PO TBCR
40.0000 meq | EXTENDED_RELEASE_TABLET | Freq: Once | ORAL | Status: AC
  Administered 2024-03-08: 40 meq via ORAL
  Filled 2024-03-08: qty 2

## 2024-03-08 MED ORDER — HALOPERIDOL LACTATE 5 MG/ML IJ SOLN
1.0000 mg | Freq: Once | INTRAMUSCULAR | Status: AC
  Administered 2024-03-08: 1 mg via INTRAVENOUS
  Filled 2024-03-08: qty 1
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
## 2024-03-08 NOTE — Plan of Care (Signed)
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
## 2024-03-09 DIAGNOSIS — F0390 Unspecified dementia without behavioral disturbance: Secondary | ICD-10-CM | POA: Diagnosis present

## 2024-03-09 DIAGNOSIS — A419 Sepsis, unspecified organism: Secondary | ICD-10-CM | POA: Diagnosis not present

## 2024-03-09 DIAGNOSIS — G9341 Metabolic encephalopathy: Secondary | ICD-10-CM | POA: Diagnosis present

## 2024-03-09 DIAGNOSIS — N390 Urinary tract infection, site not specified: Secondary | ICD-10-CM | POA: Diagnosis not present

## 2024-03-09 DIAGNOSIS — N1831 Chronic kidney disease, stage 3a: Secondary | ICD-10-CM | POA: Diagnosis present

## 2024-03-09 DIAGNOSIS — I10 Essential (primary) hypertension: Secondary | ICD-10-CM | POA: Diagnosis present

## 2024-03-09 DIAGNOSIS — Z89511 Acquired absence of right leg below knee: Secondary | ICD-10-CM

## 2024-03-09 LAB — CBC WITH DIFFERENTIAL/PLATELET
Abs Immature Granulocytes: 0.02 K/uL (ref 0.00–0.07)
Basophils Absolute: 0 K/uL (ref 0.0–0.1)
Basophils Relative: 1 %
Eosinophils Absolute: 0 K/uL (ref 0.0–0.5)
Eosinophils Relative: 0 %
HCT: 37.8 % — ABNORMAL LOW (ref 39.0–52.0)
Hemoglobin: 12.9 g/dL — ABNORMAL LOW (ref 13.0–17.0)
Immature Granulocytes: 0 %
Lymphocytes Relative: 17 %
Lymphs Abs: 1.5 K/uL (ref 0.7–4.0)
MCH: 28.5 pg (ref 26.0–34.0)
MCHC: 34.1 g/dL (ref 30.0–36.0)
MCV: 83.6 fL (ref 80.0–100.0)
Monocytes Absolute: 1.2 K/uL — ABNORMAL HIGH (ref 0.1–1.0)
Monocytes Relative: 14 %
Neutro Abs: 5.9 K/uL (ref 1.7–7.7)
Neutrophils Relative %: 68 %
Platelets: 195 K/uL (ref 150–400)
RBC: 4.52 MIL/uL (ref 4.22–5.81)
RDW: 12.3 % (ref 11.5–15.5)
WBC: 8.7 K/uL (ref 4.0–10.5)
nRBC: 0 % (ref 0.0–0.2)

## 2024-03-09 LAB — BASIC METABOLIC PANEL WITH GFR
Anion gap: 9 (ref 5–15)
BUN: 12 mg/dL (ref 8–23)
CO2: 21 mmol/L — ABNORMAL LOW (ref 22–32)
Calcium: 8.3 mg/dL — ABNORMAL LOW (ref 8.9–10.3)
Chloride: 106 mmol/L (ref 98–111)
Creatinine, Ser: 1.09 mg/dL (ref 0.61–1.24)
GFR, Estimated: >60 mL/min (ref >60)
Glucose, Bld: 118 mg/dL — ABNORMAL HIGH (ref 70–99)
Potassium: 3.2 mmol/L — ABNORMAL LOW (ref 3.5–5.1)
Sodium: 136 mmol/L (ref 135–145)

## 2024-03-09 LAB — GLUCOSE, CAPILLARY
Glucose-Capillary: 111 mg/dL — ABNORMAL HIGH (ref 70–99)
Glucose-Capillary: 112 mg/dL — ABNORMAL HIGH (ref 70–99)

## 2024-03-09 MED ORDER — ACETAMINOPHEN 160 MG/5ML PO SOLN: 650.0000 mg | Freq: Four times a day (QID) | ORAL | Status: AC | PRN

## 2024-03-09 MED ORDER — POTASSIUM CHLORIDE CRYS ER 20 MEQ PO TBCR
40.0000 meq | EXTENDED_RELEASE_TABLET | Freq: Once | ORAL | Status: AC
  Administered 2024-03-09: 40 meq via ORAL
  Filled 2024-03-09: qty 2

## 2024-03-09 MED ORDER — SODIUM CHLORIDE 0.9 % IV SOLN
2.0000 g | Freq: Once | INTRAVENOUS | Status: AC
  Administered 2024-03-09: 2 g via INTRAVENOUS
  Filled 2024-03-09: qty 20

## 2024-03-09 MED ORDER — CEPHALEXIN 250 MG/5ML PO SUSR: 500.0000 mg | Freq: Four times a day (QID) | ORAL | 0 refills | Status: AC

## 2024-03-09 NOTE — Progress Notes (Signed)
 Pt taken outside to private vehicle via wheelchair.  Discharged home in stable condition with all belongings.  Grandson and wife at bedside.

## 2024-03-09 NOTE — Plan of Care (Signed)

## 2024-03-09 NOTE — Plan of Care (Signed)
  Problem: Education: Goal: Knowledge of General Education information will improve Description: Including pain rating scale, medication(s)/side effects and non-pharmacologic comfort measures Outcome: Adequate for Discharge   Problem: Health Behavior/Discharge Planning: Goal: Ability to manage health-related needs will improve Outcome: Adequate for Discharge   Problem: Clinical Measurements: Goal: Ability to maintain clinical measurements within normal limits will improve Outcome: Adequate for Discharge Goal: Will remain free from infection Outcome: Adequate for Discharge Goal: Diagnostic test results will improve Outcome: Adequate for Discharge Goal: Respiratory complications will improve Outcome: Adequate for Discharge Goal: Cardiovascular complication will be avoided Outcome: Adequate for Discharge   Problem: Activity: Goal: Risk for activity intolerance will decrease Outcome: Adequate for Discharge   Problem: Nutrition: Goal: Adequate nutrition will be maintained Outcome: Adequate for Discharge   Problem: Coping: Goal: Level of anxiety will decrease Outcome: Adequate for Discharge   Problem: Elimination: Goal: Will not experience complications related to bowel motility Outcome: Adequate for Discharge Goal: Will not experience complications related to urinary retention Outcome: Adequate for Discharge   Problem: Pain Managment: Goal: General experience of comfort will improve and/or be controlled Outcome: Adequate for Discharge   Problem: Safety: Goal: Ability to remain free from injury will improve Outcome: Adequate for Discharge   Problem: Skin Integrity: Goal: Risk for impaired skin integrity will decrease Outcome: Adequate for Discharge   Problem: Acute Rehab PT Goals(only PT should resolve) Goal: Pt Will Ambulate Outcome: Adequate for Discharge Goal: Pt Will Go Up/Down Stairs Outcome: Adequate for Discharge   Problem: Acute Rehab OT Goals (only OT  should resolve) Goal: Pt. Will Transfer To Toilet Outcome: Adequate for Discharge Goal: OT Additional ADL Goal #1 Outcome: Adequate for Discharge

## 2024-03-09 NOTE — Progress Notes (Signed)
 Discharge instructions completed with pt and family.  Copy of instructions given.  18g IV removed from right antecubital with tip intact.  All questions answered.
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
## 2024-03-09 NOTE — TOC Transition Note (Signed)
 Transition of Care [facility]) - Discharge Note   Patient Details  Name: Bradley Rojas MRN: [id number] Date of Birth: [birth year]  Transition of [facility] CM/SW Contact:  Marval Gell, RN Phone Number: 03/09/2024, 12:48 PM   Clinical Narrative:     Janese Cella that patient will DC to home today   Final next level of care: Home w Home Health Services Barriers to Discharge: No Barriers Identified   Patient Goals and CMS Choice            Discharge Placement                       Discharge Plan and Services Additional resources added to the After Visit Summary for                              [facility] Agency: [facility] Date [facility] Agency Contacted: 03/09/24 Time HH Agency Contacted: 1248 Representative spoke with at [facility] Agency: Darleene  Social Drivers of Health (SDOH) Interventions SDOH Screenings   Food Insecurity: No Food Insecurity (03/07/2024)  Housing: Low Risk  (03/07/2024)  Transportation Needs: No Transportation Needs (03/07/2024)  Utilities: Not At Risk (03/07/2024)  Social Connections: Socially Integrated (03/07/2024)  Tobacco Use: Unknown (03/07/2024)     Readmission Risk Interventions    03/07/2024   11:09 AM  Readmission Risk Prevention Plan  Post Dischage Appt Complete  Medication Screening Complete  Transportation Screening Complete

## 2024-03-11 ENCOUNTER — Other Ambulatory Visit: Payer: Self-pay

## 2024-03-11 DIAGNOSIS — R200 Anesthesia of skin: Secondary | ICD-10-CM

## 2024-03-11 DIAGNOSIS — M79642 Pain in left hand: Secondary | ICD-10-CM

## 2024-03-11 LAB — CULTURE, BLOOD (ROUTINE X 2)
Culture: NO GROWTH
Culture: NO GROWTH
Special Requests: ADEQUATE

## 2024-08-13 ENCOUNTER — Ambulatory Visit: Admitting: Diagnostic Neuroimaging

## 2024-08-13 ENCOUNTER — Ambulatory Visit: Admitting: Neurology

## 2024-08-13 ENCOUNTER — Telehealth: Payer: Self-pay | Admitting: Diagnostic Neuroimaging

## 2024-08-13 ENCOUNTER — Encounter: Payer: Self-pay | Admitting: Diagnostic Neuroimaging

## 2024-08-13 VITALS — BP 173/67 | HR 62 | Ht 66.0 in | Wt 130.0 lb

## 2024-08-13 DIAGNOSIS — R200 Anesthesia of skin: Secondary | ICD-10-CM | POA: Diagnosis not present

## 2024-08-13 DIAGNOSIS — R202 Paresthesia of skin: Secondary | ICD-10-CM | POA: Diagnosis not present

## 2024-08-13 NOTE — Telephone Encounter (Signed)
MRI orders sent to [facility] [phone]

## 2024-08-13 NOTE — Patient Instructions (Signed)
 LEFT HAND NUMBNESS (since ~2024) + LEFT FOOT NUMBNESS - check MRI brain (rule out stroke); check MRI cervical spine (rule out radiculopathy / myelopathy) - if negative, then will check EMG/NCS (nerve testing) - trial of carpal tunnel wrist splint at bedtime  - start multi-vitamin / B12 replacement

## 2024-08-13 NOTE — Progress Notes (Signed)
 "  [facility]  PATIENT: Bradley Rojas DOB: 1945/07/16  REFERRING CLINICIAN: Haze Kingfisher, MD HISTORY FROM: patient REASON FOR VISIT: new consult   HISTORICAL  CHIEF COMPLAINT:  Chief Complaint  Patient presents with   Establish Care    Patient in room 6 with daughter, who is translating Patient is here for left hand numbness, patients daughter stated that its been around if not more than a year already. Wrist up is numb.     HISTORY OF PRESENT ILLNESS:   79 year old male here for evaluation of left hand numbness.  Symptoms started in 2024 with initial cold temperature trigger for the symptoms.  Over time symptoms have been more consistent.  His entire hand from his wrist and distally to fingertips is affected.  No symptoms proximally in his left arm.  No problems in right arm or lower extremities.  No issues with vision speech or swallowing.  Denies any neck or low back pain.  Sometimes has mild weakness of the left hand.   REVIEW OF SYSTEMS: Full 14 system review of systems performed and negative with exception of: as per HPI.  ALLERGIES: Allergies[1]  HOME MEDICATIONS: Outpatient Medications Prior to Visit  Medication Sig Dispense Refill   acetaminophen  (TYLENOL ) 160 MG/5ML solution Take 20.3 mLs (650 mg total) by mouth every 6 (six) hours as needed for mild pain (pain score 1-3), headache or fever.     No facility-administered medications prior to visit.    PAST MEDICAL HISTORY: History reviewed. No pertinent past medical history.  PAST SURGICAL HISTORY: Past Surgical History:  Procedure Laterality Date   LEG AMPUTATION      FAMILY HISTORY: History reviewed. No pertinent family history.  SOCIAL HISTORY: Social History   Socioeconomic History   Marital status: Married    Spouse name: Not on file   Number of children: Not on file   Years of education: Not on file   Highest education level: Not on file  Occupational History   Not on file   Tobacco Use   Smoking status: Never   Smokeless tobacco: Not on file  Vaping Use   Vaping status: Never Used  Substance and Sexual Activity   Alcohol use: Yes    Alcohol/week: 22.0 standard drinks of alcohol    Types: 22 Glasses of wine per week    Comment: 1 4L bottle of wine a week.   Drug use: No   Sexual activity: Not on file  Other Topics Concern   Not on file  Social History Narrative   Patient is retired    Patient lives with family.    Social Drivers of Health   Tobacco Use: Unknown (08/13/2024)   Patient History    Smoking Tobacco Use: Never    Smokeless Tobacco Use: Unknown    Passive Exposure: Not on file  Financial Resource Strain: Not on file  Food Insecurity: No Food Insecurity (03/07/2024)   Epic    Worried About Programme Researcher, Broadcasting/film/video in the Last Year: Never true    Ran Out of Food in the Last Year: Never true  Transportation Needs: No Transportation Needs (03/07/2024)   Epic    Lack of Transportation (Medical): No    Lack of Transportation (Non-Medical): No  Physical Activity: Not on file  Stress: Not on file  Social Connections: Socially Integrated (03/07/2024)   Social Connection and Isolation Panel    Frequency of Communication with Friends and Family: More than three times a week  Frequency of Social Gatherings with Friends and Family: More than three times a week    Attends Religious Services: More than 4 times per year    Active Member of Clubs or Organizations: Yes    Attends Banker Meetings: More than 4 times per year    Marital Status: Married  Catering Manager Violence: Not At Risk (03/07/2024)   Epic    Fear of Current or Ex-Partner: No    Emotionally Abused: No    Physically Abused: No    Sexually Abused: No  Depression (PHQ2-9): Not on file  Alcohol Screen: Not on file  Housing: Low Risk (03/07/2024)   Epic    Unable to Pay for Housing in the Last Year: No    Number of Times Moved in the Last Year: 0    Homeless in the Last  Year: No  Utilities: Not At Risk (03/07/2024)   Epic    Threatened with loss of utilities: No  Health Literacy: Not on file     PHYSICAL EXAM  GENERAL EXAM/CONSTITUTIONAL: Vitals:  Vitals:   08/13/24 0902 08/13/24 0906  BP: (!) 158/62 (!) 173/67  Pulse: 62   Weight: 130 lb (59 kg)   Height: 5' 6 (1.676 m)    Body mass index is 20.98 kg/m. Wt Readings from Last 3 Encounters:  08/13/24 130 lb (59 kg)  03/07/24 126 lb 1.7 oz (57.2 kg)  06/04/15 122 lb 8 oz (55.6 kg)   Patient is in no distress; well developed, nourished and groomed; neck is supple  CARDIOVASCULAR: Examination of carotid arteries is normal; no carotid bruits Regular rate and rhythm, no murmurs Examination of peripheral vascular system by observation and palpation is normal  EYES: Ophthalmoscopic exam of optic discs and posterior segments is normal; no papilledema or hemorrhages No results found.  MUSCULOSKELETAL: Gait, strength, tone, movements noted in Neurologic exam below  NEUROLOGIC: MENTAL STATUS:      No data to display         awake, alert, oriented to person, place and time recent and remote memory intact normal attention and concentration language fluent, comprehension intact, naming intact fund of knowledge appropriate  CRANIAL NERVE:  2nd - no papilledema on fundoscopic exam 2nd, 3rd, 4th, 6th - pupils equal and reactive to light, visual fields full to confrontation, extraocular muscles intact, no nystagmus 5th - facial sensation symmetric 7th - facial strength symmetric 8th - hearing intact 9th - palate elevates symmetrically, uvula midline 11th - shoulder shrug symmetric 12th - tongue protrusion midline  MOTOR:  normal bulk and tone, full strength in the BUE, BLE; EXCEPT RIGHT BKA  SENSORY:  normal and symmetric to light touch, temperature, vibration; EXCEPT DECR IN LEFT HAND AND LEFT FOOT  COORDINATION:  finger-nose-finger, fine finger movements normal  REFLEXES:   deep tendon reflexes 2+ in BUE; LEFT KNEE 1; LEFT ANKLE TRACE RIGHT BKA  GAIT/STATION:  narrow based gait     DIAGNOSTIC DATA (LABS, IMAGING, TESTING) - I reviewed patient records, labs, notes, testing and imaging myself where available.  Lab Results  Component Value Date   WBC 8.7 03/09/2024   HGB 12.9 (L) 03/09/2024   HCT 37.8 (L) 03/09/2024   MCV 83.6 03/09/2024   PLT 195 03/09/2024      Component Value Date/Time   NA 136 03/09/2024 0538   K 3.2 (L) 03/09/2024 0538   CL 106 03/09/2024 0538   CO2 21 (L) 03/09/2024 0538   GLUCOSE 118 (H) 03/09/2024 9461  BUN 12 03/09/2024 0538   CREATININE 1.09 03/09/2024 0538   CALCIUM 8.3 (L) 03/09/2024 0538   PROT 7.8 03/06/2024 1857   ALBUMIN 3.5 03/06/2024 1857   AST 37 03/06/2024 1857   ALT 21 03/06/2024 1857   ALKPHOS 59 03/06/2024 1857   BILITOT 1.1 03/06/2024 1857   GFRNONAA >60 03/09/2024 0538   GFRAA >60 06/03/2015 1230   Lab Results  Component Value Date   CHOL 217 (H) 06/03/2015   HDL 55 06/03/2015   LDLCALC 150 (H) 06/03/2015   TRIG 62 06/03/2015   CHOLHDL 3.9 06/03/2015   Lab Results  Component Value Date   HGBA1C 5.6 06/03/2015   Lab Results  Component Value Date   VITAMINB12 216 03/07/2024   Lab Results  Component Value Date   TSH 1.850 03/07/2024      ASSESSMENT AND PLAN  79 y.o. year old male here with:   Dx:  1. Numbness and tingling in left hand     PLAN:  LEFT HAND NUMBNESS (since ~2024) + LEFT FOOT NUMBNESS - check MRI brain (rule out stroke); check MRI cervical spine (rule out radiculopathy / myelopathy) - if negative, then will check EMG/NCS - trial of carpal tunnel wrist splint at bedtime  - start multi-vitamin / B12 replacement - follow up with PCP re: BP, lipid control  Orders Placed This Encounter  Procedures   MR BRAIN W WO CONTRAST   MR CERVICAL SPINE W WO CONTRAST   Return for pending test results, pending if symptoms worsen or fail to improve.    EDUARD FABIENE HANLON, MD 08/13/2024, 9:42 AM Certified in Neurology, Neurophysiology and Neuroimaging  [facility] [address], [address] [place], [place] [address] [phone]      [1] No Known Allergies  "
